# Patient Record
Sex: Male | Born: 1995 | Race: White | Hispanic: No | Marital: Single | Smoking: Current every day smoker
Health system: Southern US, Community
[De-identification: ages and names within clinical notes are randomized; demographics above are authoritative.]

## PROBLEM LIST (undated history)

## (undated) DIAGNOSIS — J45909 Unspecified asthma, uncomplicated: Secondary | ICD-10-CM

## (undated) DIAGNOSIS — H10029 Other mucopurulent conjunctivitis, unspecified eye: Secondary | ICD-10-CM

---

## 2018-01-04 ENCOUNTER — Encounter (HOSPITAL_COMMUNITY): Payer: Self-pay

## 2018-01-04 ENCOUNTER — Ambulatory Visit (HOSPITAL_COMMUNITY)
Admission: EM | Admit: 2018-01-04 | Discharge: 2018-01-04 | Disposition: A | Discharge status: Home / Self Care | Payer: Self-pay | Attending: Internal Medicine | Admitting: Internal Medicine

## 2018-01-04 ENCOUNTER — Other Ambulatory Visit: Payer: Self-pay

## 2018-01-04 DIAGNOSIS — J069 Acute upper respiratory infection, unspecified: Secondary | ICD-10-CM | POA: Insufficient documentation

## 2018-01-04 DIAGNOSIS — B9789 Other viral agents as the cause of diseases classified elsewhere: Secondary | ICD-10-CM | POA: Insufficient documentation

## 2018-01-04 MED ORDER — BENZONATATE 100 MG PO CAPS: 100.0000 mg | ORAL_CAPSULE | Freq: Three times a day (TID) | ORAL | 0 refills | Status: DC

## 2018-01-04 NOTE — ED Triage Notes (Signed)
Pt cc headache and cough and congestion x 3 days.

## 2018-01-04 NOTE — Discharge Instructions (Signed)
I believe this is a viral illness We will treat your cough with Tessalon Perles You can do Mucinex over-the-counter for cough and congestion Tylenol or ibuprofen for pain or fever Try to cut back on smoking as this will exacerbate your symptoms Follow up as needed for continued or worsening symptoms

## 2018-01-08 NOTE — ED Provider Notes (Signed)
[facility]    CSN: [id number] Arrival date & time: 01/04/18  1907     History   Chief Complaint Chief Complaint  Patient presents with  . Cough    HPI Jesse Mitchell is a 22 y.o. male.    URI  Presenting symptoms: congestion, cough, ear pain, rhinorrhea and sore throat   Severity:  Moderate Duration:  3 days Timing:  Constant Chronicity:  New Worsened by:  Nothing Ineffective treatments:  OTC medications Associated symptoms: myalgias   Risk factors: no recent illness, no recent travel and no sick contacts     History reviewed. No pertinent past medical history.  There are no active problems to display for this patient.   History reviewed. No pertinent surgical history.     Home Medications    Prior to Admission medications   Medication Sig Start Date End Date Taking? Authorizing Provider  benzonatate (TESSALON) 100 MG capsule Take 1 capsule (100 mg total) by mouth every 8 (eight) hours. 01/04/18   Janace ArisBast, Juandavid Dallman A, NP    Family History History reviewed. No pertinent family history.  Social History Social History   Tobacco Use  . Smoking status: Current Every Day Smoker    Types: Cigarettes  . Smokeless tobacco: Never Used  . Tobacco comment: pack a day  Substance Use Topics  . Alcohol use: Not on file  . Drug use: Not on file     Allergies   Patient has no allergy information on record.   Review of Systems Review of Systems  HENT: Positive for congestion, ear pain, rhinorrhea and sore throat.   Respiratory: Positive for cough.   Musculoskeletal: Positive for myalgias.     Physical Exam Triage Vital Signs ED Triage Vitals  Enc Vitals Group     BP 01/04/18 2039 126/71     Pulse Rate 01/04/18 2039 99     Resp 01/04/18 2039 16     Temp 01/04/18 2039 99.1 F (37.3 C)     Temp Source 01/04/18 2039 Oral     SpO2 01/04/18 2039 100 %     Weight 01/04/18 2037 215 lb (97.5 kg)     Height --      Head Circumference --      Peak  Flow --      Pain Score 01/04/18 2037 3     Pain Loc --      Pain Edu? --      Excl. in GC? --    No data found.  Updated Vital Signs BP 126/71 (BP Location: Right Arm)   Pulse 99   Temp 99.1 F (37.3 C) (Oral)   Resp 16   Wt 215 lb (97.5 kg)   SpO2 100%   Visual Acuity Right Eye Distance:   Left Eye Distance:   Bilateral Distance:    Right Eye Near:   Left Eye Near:    Bilateral Near:     Physical Exam Vitals signs and nursing note reviewed.  Constitutional:      Appearance: Normal appearance. He is not ill-appearing or toxic-appearing.  HENT:     Head: Normocephalic and atraumatic.     Right Ear: Tympanic membrane, ear canal and external ear normal.     Left Ear: Tympanic membrane, ear canal and external ear normal.     Nose: Congestion present.     Mouth/Throat:     Pharynx: Oropharynx is clear.  Eyes:     Conjunctiva/sclera: Conjunctivae normal.  Neck:  Musculoskeletal: Normal range of motion.  Cardiovascular:     Rate and Rhythm: Normal rate and regular rhythm.     Heart sounds: Normal heart sounds.  Pulmonary:     Effort: Pulmonary effort is normal.     Breath sounds: Normal breath sounds.  Musculoskeletal: Normal range of motion.  Lymphadenopathy:     Cervical: No cervical adenopathy.  Skin:    General: Skin is warm and dry.  Neurological:     Mental Status: He is alert.  Psychiatric:        Mood and Affect: Mood normal.      UC Treatments / Results  Labs (all labs ordered are listed, but only abnormal results are displayed) Labs Reviewed - No data to display  EKG None  Radiology No results found.  Procedures Procedures (including critical care time)  Medications Ordered in UC Medications - No data to display  Initial Impression / Assessment and Plan / UC Course  I have reviewed the triage vital signs and the nursing notes.  Pertinent labs & imaging results that were available during my care of the patient were reviewed by me  and considered in my medical decision making (see chart for details).     Viral URI  Tessalon pearls for cough mucinex OTC for cough and congestion Follow up as needed for continued or worsening symptoms  Final Clinical Impressions(s) / UC Diagnoses   Final diagnoses:  Viral URI with cough     Discharge Instructions     I believe this is a viral illness We will treat your cough with Tessalon Perles You can do Mucinex over-the-counter for cough and congestion Tylenol or ibuprofen for pain or fever Try to cut back on smoking as this will exacerbate your symptoms Follow up as needed for continued or worsening symptoms     ED Prescriptions    Medication Sig Dispense Auth. Provider   benzonatate (TESSALON) 100 MG capsule Take 1 capsule (100 mg total) by mouth every 8 (eight) hours. 21 capsule Dahlia ByesBast, Eron Goble A, NP     Controlled Substance Prescriptions [place] Controlled Substance Registry consulted? no   Janace ArisBast, Layten Aiken A, NP 01/08/18 1708

## 2018-03-28 ENCOUNTER — Emergency Department (HOSPITAL_BASED_OUTPATIENT_CLINIC_OR_DEPARTMENT_OTHER)
Admission: EM | Admit: 2018-03-28 | Discharge: 2018-03-28 | Disposition: A | Discharge status: Home / Self Care | Payer: Self-pay | Attending: Emergency Medicine | Admitting: Emergency Medicine

## 2018-03-28 ENCOUNTER — Other Ambulatory Visit: Payer: Self-pay

## 2018-03-28 ENCOUNTER — Encounter (HOSPITAL_BASED_OUTPATIENT_CLINIC_OR_DEPARTMENT_OTHER): Payer: Self-pay | Admitting: Emergency Medicine

## 2018-03-28 ENCOUNTER — Emergency Department (HOSPITAL_BASED_OUTPATIENT_CLINIC_OR_DEPARTMENT_OTHER): Payer: Self-pay

## 2018-03-28 DIAGNOSIS — F17210 Nicotine dependence, cigarettes, uncomplicated: Secondary | ICD-10-CM | POA: Insufficient documentation

## 2018-03-28 DIAGNOSIS — L03213 Periorbital cellulitis: Secondary | ICD-10-CM | POA: Insufficient documentation

## 2018-03-28 HISTORY — DX: Other mucopurulent conjunctivitis, unspecified eye: H10.029

## 2018-03-28 HISTORY — DX: Unspecified asthma, uncomplicated: J45.909

## 2018-03-28 LAB — BASIC METABOLIC PANEL
Anion gap: 5 (ref 5–15)
BUN: 8 mg/dL (ref 6–20)
CO2: 25 mmol/L (ref 22–32)
Calcium: 9 mg/dL (ref 8.9–10.3)
Chloride: 107 mmol/L (ref 98–111)
Creatinine, Ser: 0.67 mg/dL (ref 0.61–1.24)
GFR calc Af Amer: >60 mL/min (ref >60)
GFR calc non Af Amer: >60 mL/min (ref >60)
Glucose, Bld: 109 mg/dL — ABNORMAL HIGH (ref 70–99)
POTASSIUM: 3.9 mmol/L (ref 3.5–5.1)
SODIUM: 137 mmol/L (ref 135–145)

## 2018-03-28 LAB — CBC WITH DIFFERENTIAL/PLATELET
ABS IMMATURE GRANULOCYTES: 0.07 10*3/uL (ref 0.00–0.07)
Basophils Absolute: 0.1 10*3/uL (ref 0.0–0.1)
Basophils Relative: 1 %
Eosinophils Absolute: 0.1 10*3/uL (ref 0.0–0.5)
Eosinophils Relative: 1 %
HCT: 45.3 % (ref 39.0–52.0)
Hemoglobin: 15.3 g/dL (ref 13.0–17.0)
IMMATURE GRANULOCYTES: 1 %
Lymphocytes Relative: 9 %
Lymphs Abs: 1.4 10*3/uL (ref 0.7–4.0)
MCH: 30.7 pg (ref 26.0–34.0)
MCHC: 33.8 g/dL (ref 30.0–36.0)
MCV: 91 fL (ref 80.0–100.0)
Monocytes Absolute: 2.3 10*3/uL — ABNORMAL HIGH (ref 0.1–1.0)
Monocytes Relative: 15 %
NEUTROS ABS: 11.5 10*3/uL — AB (ref 1.7–7.7)
Neutrophils Relative %: 73 %
Platelets: 213 10*3/uL (ref 150–400)
RBC: 4.98 MIL/uL (ref 4.22–5.81)
RDW: 12.4 % (ref 11.5–15.5)
WBC: 15.4 10*3/uL — ABNORMAL HIGH (ref 4.0–10.5)
nRBC: 0 % (ref 0.0–0.2)

## 2018-03-28 MED ORDER — TETRACAINE HCL 0.5 % OP SOLN
2.0000 [drp] | Freq: Once | OPHTHALMIC | Status: AC
  Administered 2018-03-28: 2 [drp] via OPHTHALMIC
  Filled 2018-03-28: qty 4

## 2018-03-28 MED ORDER — TETANUS-DIPHTH-ACELL PERTUSSIS 5-2.5-18.5 LF-MCG/0.5 IM SUSP
0.5000 mL | Freq: Once | INTRAMUSCULAR | Status: AC
  Administered 2018-03-28: 0.5 mL via INTRAMUSCULAR
  Filled 2018-03-28: qty 0.5

## 2018-03-28 MED ORDER — CEFDINIR 300 MG PO CAPS
300.0000 mg | ORAL_CAPSULE | Freq: Once | ORAL | Status: DC
  Filled 2018-03-28: qty 1

## 2018-03-28 MED ORDER — CLINDAMYCIN PHOSPHATE 600 MG/50ML IV SOLN
600.0000 mg | Freq: Once | INTRAVENOUS | Status: AC
  Administered 2018-03-28: 600 mg via INTRAVENOUS
  Filled 2018-03-28: qty 50

## 2018-03-28 MED ORDER — FLUORESCEIN SODIUM 1 MG OP STRP
1.0000 | ORAL_STRIP | Freq: Once | OPHTHALMIC | Status: AC
  Administered 2018-03-28: 1 via OPHTHALMIC
  Filled 2018-03-28: qty 1

## 2018-03-28 MED ORDER — CLINDAMYCIN HCL 300 MG PO CAPS: 300.0000 mg | ORAL_CAPSULE | Freq: Three times a day (TID) | ORAL | 0 refills | Status: DC

## 2018-03-28 MED ORDER — CEFDINIR 300 MG PO CAPS: 300.0000 mg | ORAL_CAPSULE | Freq: Two times a day (BID) | ORAL | 0 refills | Status: DC

## 2018-03-28 MED ORDER — IOHEXOL 300 MG/ML  SOLN
100.0000 mL | Freq: Once | INTRAMUSCULAR | Status: AC | PRN
  Administered 2018-03-28: 80 mL via INTRAVENOUS

## 2018-03-28 NOTE — ED Notes (Signed)
ED Provider at bedside. 

## 2018-03-28 NOTE — ED Notes (Signed)
ED Provider at bedside. Dr Steinl 

## 2018-03-28 NOTE — ED Provider Notes (Signed)
[facility] EMERGENCY DEPARTMENT Provider Note   CSN: [id number] Arrival date & time: 03/28/18  1209    History   Chief Complaint Chief Complaint  Patient presents with   Facial Swelling    HPI Jesse Mitchell is a 23 y.o. male with a hx of tobacco abuse who presents to the ER with complaints of L sided facial swelling/redness that worsened this AM. Patient states he has had a small cyst on the bridge of his nose for about a year that was never particularly bothersome until it came to a head about 1 week ago. He states he applied pressure with expression of purulent material with relief. He notes that it had been okay again until last night the area began to have some spreading redness (still localized to the nose) and he picked at it again this time without drainage. He went to bed and this AM woke up w/ redness & swelling spread to the L eye. He states it is not overly painful unless pushed on. The eye itself does not hurt either. He had some improvement w/ warm compresses today, no other alleviating/aggravating factors. Seen @ UC sent to ER for further evaluation. Denies fever, chills, chang in vision, contact lens use, pain with EOM, eye purulent drainage, congestion, or sore throat.     HPI  Past Medical History:  Diagnosis Date   Asthma    Pink eye     There are no active problems to display for this patient.   History reviewed. No pertinent surgical history.      Home Medications    Prior to Admission medications   Medication Sig Start Date End Date Taking? Authorizing Provider  benzonatate (TESSALON) 100 MG capsule Take 1 capsule (100 mg total) by mouth every 8 (eight) hours. 01/04/18   Janace Aris, NP    Family History No family history on file.  Social History Social History   Tobacco Use   Smoking status: Current Every Day Smoker    Types: Cigarettes   Smokeless tobacco: Never Used   Tobacco comment: pack a day  Substance Use Topics    Alcohol use: Not Currently   Drug use: Never     Allergies   Other   Review of Systems Review of Systems  Constitutional: Negative for chills and fever.  HENT: Negative for congestion, ear pain and sore throat.   Eyes: Negative for photophobia, pain, discharge, itching and visual disturbance.       Positive for periorbital swelling/erythema,.   Skin: Positive for wound.  All other systems reviewed and are negative.    Physical Exam Updated Vital Signs BP (!) 155/87 (BP Location: Left Arm)    Pulse (!) 103    Temp 98.4 F (36.9 C) (Oral)    Resp 16    Ht 6\' 1"  (1.854 m)    Wt 95.3 kg    SpO2 100%    BMI 27.71 kg/m   Physical Exam Vitals signs and nursing note reviewed.  Constitutional:      General: He is not in acute distress.    Appearance: He is well-developed. He is not toxic-appearing.  HENT:     Head: Normocephalic and atraumatic.     Right Ear: Tympanic membrane, ear canal and external ear normal.     Left Ear: Tympanic membrane, ear canal and external ear normal.     Nose: No congestion or rhinorrhea.     Mouth/Throat:     Mouth: Mucous membranes are  moist.     Comments: Uvula midline.  Eyes:     General:        Right eye: No discharge.        Left eye: No discharge.     Extraocular Movements: Extraocular movements intact.     Conjunctiva/sclera:     Right eye: Right conjunctiva is not injected. No chemosis, exudate or hemorrhage.    Left eye: Left conjunctiva is injected (minimal). No exudate or hemorrhage.    Comments: Some tearing from L eye.  PERRL.  No photophobia.  No proptosis.  Nasal bridge has somewhat scabbed area that is mildly tender and swollen. Erythema/swelling around this area which extend to L periorbital region.  Visual Acuity Bilateral Distance:20/100 (pt states he is supposed to wear corrective lenses (glasses) for distance.) R Distance:20/100 L Distance:20/70  L eye Fluorescein stain: No uptake. No evidence of corneal  abrasion/ulceration.  No hyphema.   No appreciable FB.   Neck:     Musculoskeletal: Normal range of motion and neck supple. No neck rigidity.  Cardiovascular:     Rate and Rhythm: Normal rate and regular rhythm.  Pulmonary:     Effort: Pulmonary effort is normal. No respiratory distress.     Breath sounds: Normal breath sounds. No wheezing, rhonchi or rales.  Abdominal:     General: There is no distension.     Palpations: Abdomen is soft.     Tenderness: There is no abdominal tenderness.  Lymphadenopathy:     Cervical: No cervical adenopathy.  Skin:    General: Skin is warm and dry.     Findings: No rash.  Neurological:     Mental Status: He is alert.     Comments: Clear speech.   Psychiatric:        Behavior: Behavior normal.        ED Treatments / Results  Labs (all labs ordered are listed, but only abnormal results are displayed) Labs Reviewed  CBC WITH DIFFERENTIAL/PLATELET - Abnormal; Notable for the following components:      Result Value   WBC 15.4 (*)    Neutro Abs 11.5 (*)    Monocytes Absolute 2.3 (*)    All other components within normal limits  BASIC METABOLIC PANEL - Abnormal; Notable for the following components:   Glucose, Bld 109 (*)    All other components within normal limits    EKG None  Radiology Ct Orbits W Contrast  Result Date: 03/28/2018 CLINICAL DATA:  23 year old male with left periorbital swelling and redness this morning. EXAM: CT ORBITS WITH CONTRAST TECHNIQUE: Multidetector CT images was performed according to the standard protocol following intravenous contrast administration. CONTRAST:  80mL OMNIPAQUE IOHEXOL 300 MG/ML  SOLN COMPARISON:  None. FINDINGS: Orbits:  Orbital walls are intact. Confluent left periorbital and preseptal soft tissue swelling and stranding, nearly 2 centimeters thick in some areas. The underlying left globe is intact and the postseptal soft tissues are symmetric and normal. No associated soft tissue gas. No  fluid collection identified. The inflammation extends across the bridge of the nose and mildly to the medial right preseptal space. Osseous structures: Other visible osseous structures are intact. Visible maxillary dentition is within normal limits. Visualized sinuses: Clear aside from minimal mucosal thickening or small retention cysts in the maxillary sinus alveolar recesses. Tympanic cavities and mastoids are clear. Soft tissues: Left forehead, periorbital and premalar space inflammation primarily on the left as stated above. Negative visible pharynx, parapharyngeal spaces, retropharyngeal space, masticator spaces, parotid  spaces. No upper cervical lymphadenopathy. The visible major vascular structures at the skull base appear patent, including the bilateral cavernous sinus. Limited intracranial: Negative. IMPRESSION: Confluent left periorbital cellulitis with only preseptal space involvement and no abscess or complicating features. Electronically Signed   By: Odessa Fleming M.D.   On: 03/28/2018 15:22    Procedures Procedures (including critical care time)  Medications Ordered in ED Medications  cefdinir (OMNICEF) capsule 300 mg (has no administration in time range)  iohexol (OMNIPAQUE) 300 MG/ML solution 100 mL (80 mLs Intravenous Contrast Given 03/28/18 1445)  fluorescein ophthalmic strip 1 strip (1 strip Left Eye Given 03/28/18 1534)  tetracaine (PONTOCAINE) 0.5 % ophthalmic solution 2 drop (2 drops Left Eye Given 03/28/18 1534)  clindamycin (CLEOCIN) IVPB 600 mg (0 mg Intravenous Stopped 03/28/18 1624)  Tdap (BOOSTRIX) injection 0.5 mL (0.5 mLs Intramuscular Given 03/28/18 1550)     Initial Impression / Assessment and Plan / ED Course  I have reviewed the triage vital signs and the nursing notes.  Pertinent labs & imaging results that were available during my care of the patient were reviewed by me and considered in my medical decision making (see chart for details).  Patient presents to the  emergency department from urgent care for evaluation of left periorbital swelling/erythema.  Patient nontoxic-appearing, no apparent distress, initial tachycardia and hypertension normalized with repeat vitals and on my exam.  Detailed exam above, patient does have notable left periorbital erythema/swelling.  Pupils equal round and reactive to light.  Extraocular movements intact and painless.  No consensual photophobia.  Fluorescein stain without uptake to suggest abrasion, no appreciable ulceration.  No visible foreign bodies.  Visual acuity is poor, but bilaterally without his glasses.  Plan for labs and CT orbits with contrast to evaluate for preseptal versus septal cellulitis.  Work-up reviewed: BC: Leukocytosis at 15.4 with left shift BMP: Fairly unremarkable CT: Findings consistent with confluent left periorbital cellulitis with only preseptal space involvement.  No abscess or complicating features noted.  Tdap updated. IV clindamycin prior to discharge. Unfortunately do not have cefdinir at this facility for first done. Discharge home with clindamycin & cefdinir. PCP follow up. Strict return precautions. I discussed results, treatment plan, need for follow-up, and return precautions with the patient. Provided opportunity for questions, patient confirmed understanding and is in agreement with plan.   Findings and plan of care discussed with supervising physician Dr. Denton Lank who is in agreement.   Final Clinical Impressions(s) / ED Diagnoses   Final diagnoses:  Preseptal cellulitis    ED Discharge Orders         Ordered    clindamycin (CLEOCIN) 300 MG capsule  3 times daily     03/28/18 1653    cefdinir (OMNICEF) 300 MG capsule  2 times daily     03/28/18 [address], [place] R, PA-C 03/28/18 1656    Cathren Laine, MD 03/30/18 1539

## 2018-03-28 NOTE — Discharge Instructions (Addendum)
You were seen in the emergency department today for left eye redness/swelling.  Your CT scan showed preseptal otherwise known as periorbital cellulitis.  This is an infection around the eye.  We are sending you home with antibiotics including clindamycin and cefdinir.  Please take these antibiotics as prescribed.  We have prescribed you new medication(s) today. Discuss the medications prescribed today with your pharmacist as they can have adverse effects and interactions with your other medicines including over the counter and prescribed medications. Seek medical evaluation if you start to experience new or abnormal symptoms after taking one of these medicines, seek care immediately if you start to experience difficulty breathing, feeling of your throat closing, facial swelling, or rash as these could be indications of a more serious allergic reaction  It is very important that you follow-up with primary care within 48 hours if you do not have primary care follow up call the attached number to make an appointment or return to the ER.  Return to the emergency department immediately for increased swelling/redness, pain, pain moving your eye, inability to move your eye, change in your vision, fever, or any other concerns at all.

## 2018-03-28 NOTE — ED Triage Notes (Signed)
Pt here for redness, clear drainage and swelling to the left eye onset 4 am today. Pt has abscess to bridge of nose that he was messing with last night.

## 2019-04-14 ENCOUNTER — Ambulatory Visit: Payer: Self-pay | Attending: Internal Medicine

## 2019-04-14 DIAGNOSIS — Z23 Encounter for immunization: Secondary | ICD-10-CM

## 2019-04-14 NOTE — Progress Notes (Signed)
   Covid-19 Vaccination Clinic  Name:  Jesse Mitchell    MRN: [id number] DOB: 1995/04/15  04/14/2019  [name] was observed post Covid-19 immunization for 15 minutes without incident. He was provided with Vaccine Information Sheet and instruction to access the V-Safe system.   [name] was instructed to call 911 with any severe reactions post vaccine: Marland Kitchen Difficulty breathing  . Swelling of face and throat  . A fast heartbeat  . A bad rash all over body  . Dizziness and weakness   Immunizations Administered    Name Date Dose VIS Date Route   Pfizer COVID-19 Vaccine 04/14/2019  9:35 AM 0.3 mL 12/24/2018 Intramuscular   Manufacturer: [employer], [employer]   Lot: [id number]   NDC: [address]-1

## 2019-05-09 ENCOUNTER — Ambulatory Visit: Payer: Self-pay | Attending: Internal Medicine

## 2019-05-09 DIAGNOSIS — Z23 Encounter for immunization: Secondary | ICD-10-CM

## 2019-05-09 NOTE — Progress Notes (Signed)
   Covid-19 Vaccination Clinic  Name:  Jesse Mitchell    MRN: [id number] DOB: 1995/04/14  05/09/2019  Jesse Mitchell was observed post Covid-19 immunization for 15 minutes without incident. He was provided with Vaccine Information Sheet and instruction to access the V-Safe system.   Jesse Mitchell was instructed to call 911 with any severe reactions post vaccine: Marland Kitchen Difficulty breathing  . Swelling of face and throat  . A fast heartbeat  . A bad rash all over body  . Dizziness and weakness   Immunizations Administered    Name Date Dose VIS Date Route   Pfizer COVID-19 Vaccine 05/09/2019 11:49 AM 0.3 mL 03/09/2018 Intramuscular   Manufacturer: [employer], [employer]   Lot: [id number]   NDC: [address]-7

## 2020-07-26 IMAGING — CT CT ORBITS WITH CONTRAST
3 series · 14 of 47 positions shown, 16 images · IV contrast (omnipaque)
Comparison: None.

CLINICAL DATA: 23-year-old male with left periorbital swelling and
redness this morning.

EXAM:
CT ORBITS WITH CONTRAST
TECHNIQUE: Multidetector CT images was performed according to the standard
protocol following intravenous contrast administration.
CONTRAST:  80mL OMNIPAQUE IOHEXOL 300 MG/ML  SOLN

[Series 3: orbits 2.0 h30s st · axial · 0.41mm/px · z∈[-140,-62]mm · 8 of 47 slices shown, 10 images]
[im 4/47  brain]
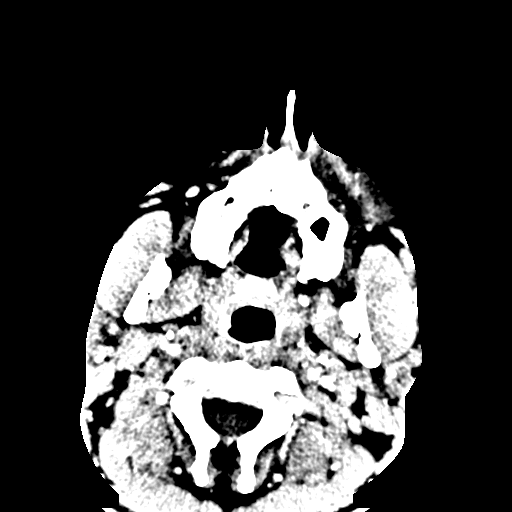
[im 4/47  bone]
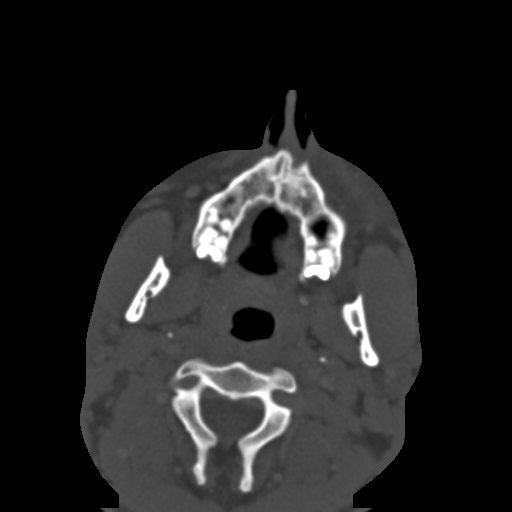
[im 10/47  bone]
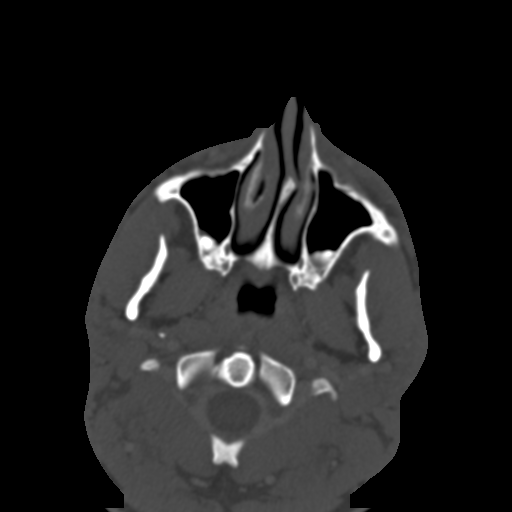
[im 15/47  bone]
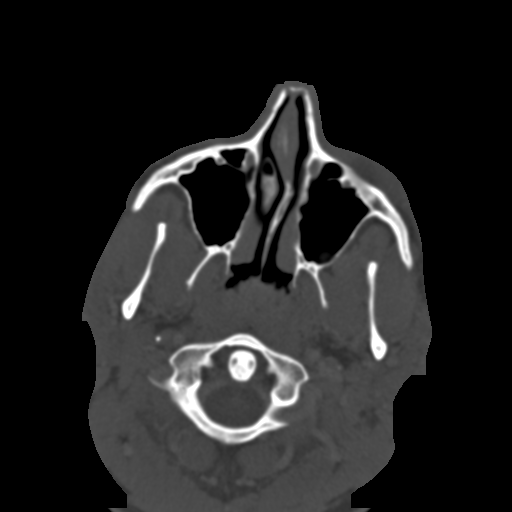
[im 21/47  bone]
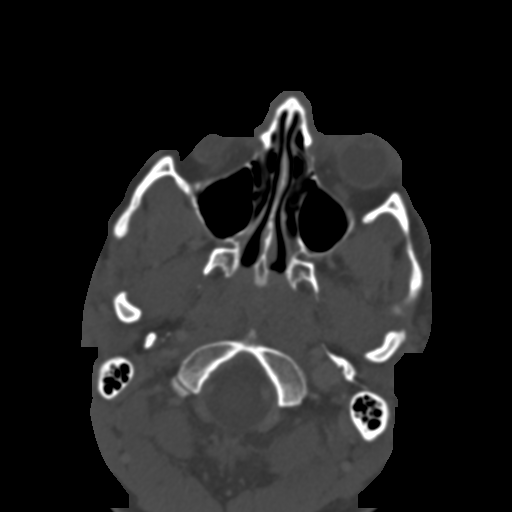
[im 26/47  brain]
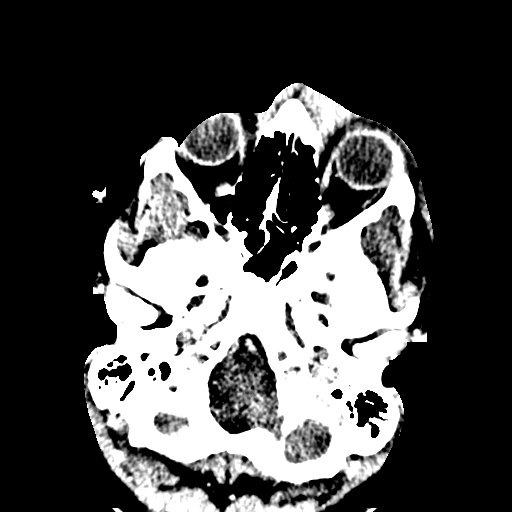
[im 26/47  bone]
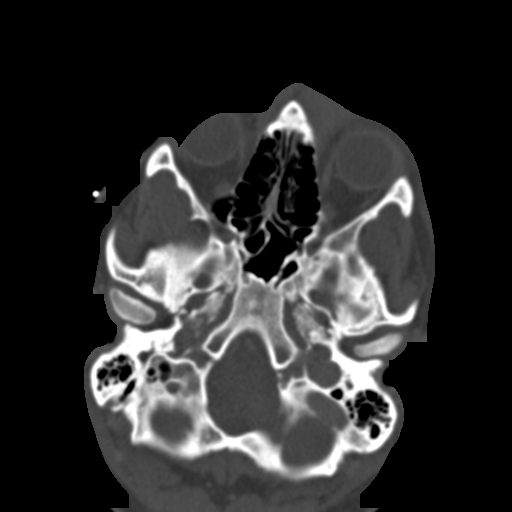
[im 32/47  bone]
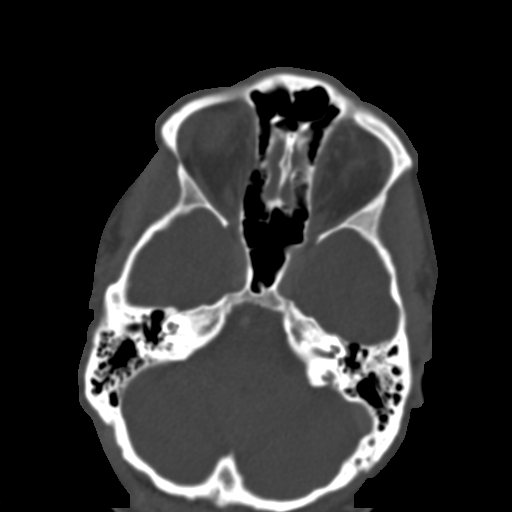
[im 37/47  bone]
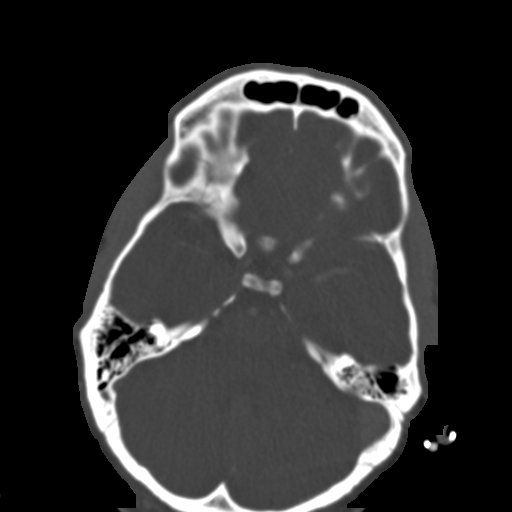
[im 43/47  bone]
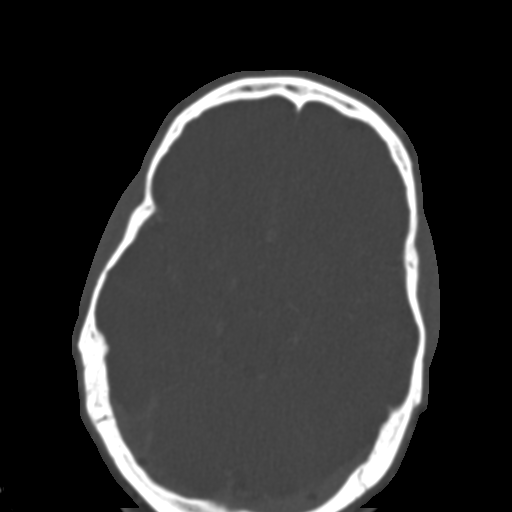

[Series 7: orbits 2.0 coronal · coronal · 0.25mm/px · 3 of 77 slices shown]
[im 26/77  bone]
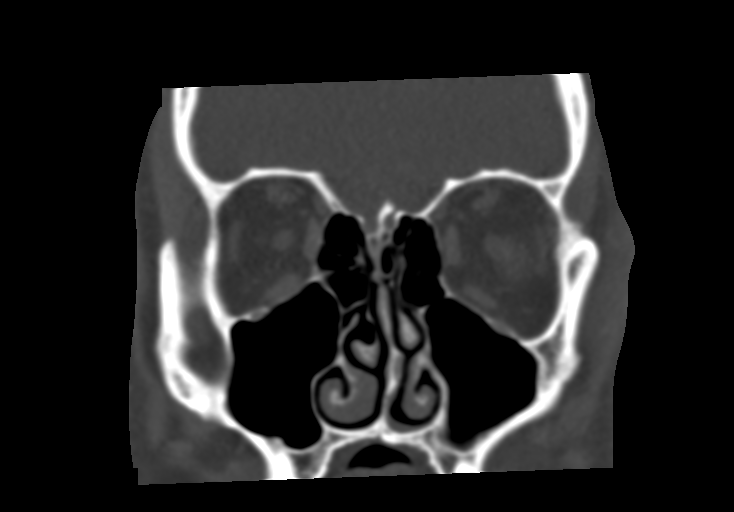
[im 34/77  bone]
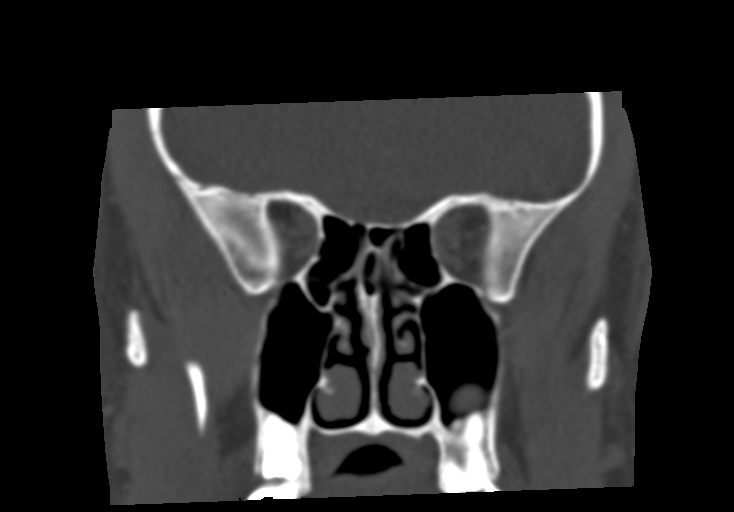
[im 43/77  bone]
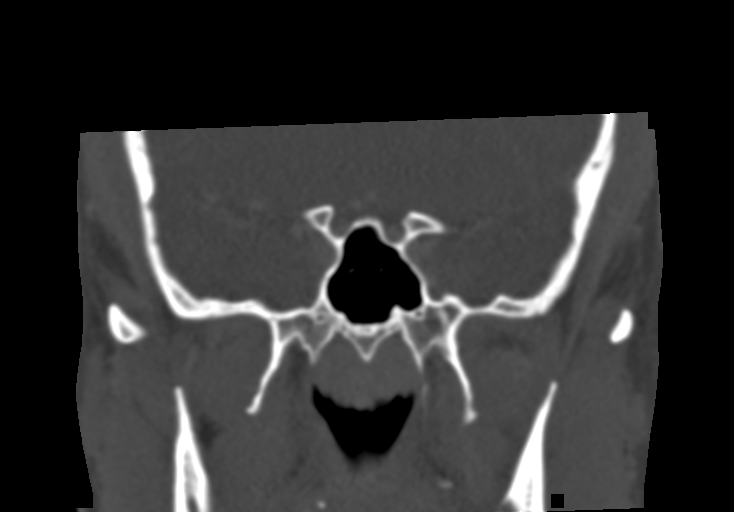

[Series 9: orbits 2.0 sagittal · sagittal · 0.25mm/px · 3 of 91 slices shown]
[im 31/91  bone]
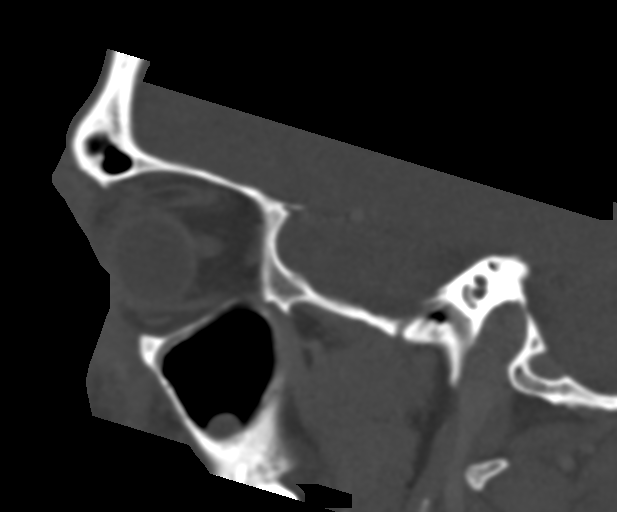
[im 46/91  bone]
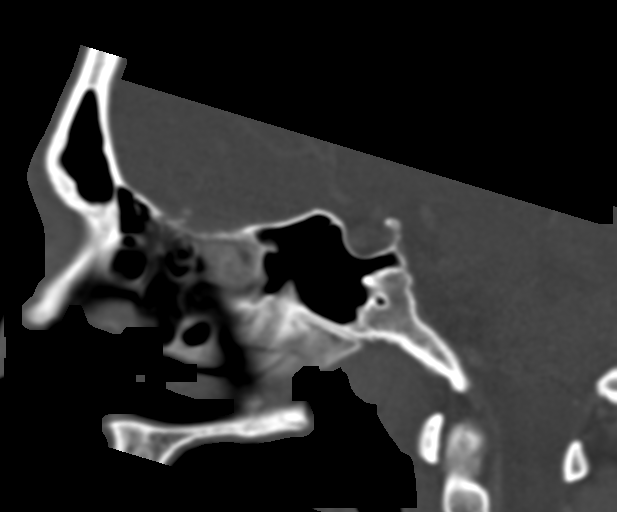
[im 61/91  bone]
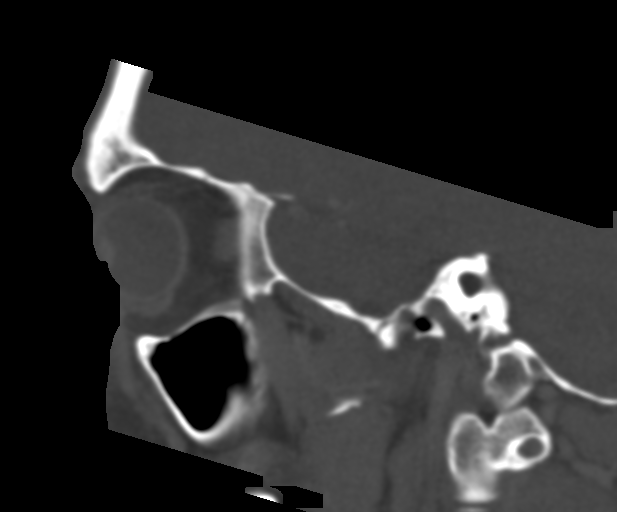

[14 of 47 positions shown; findings below may reference images not displayed]

FINDINGS: Orbits:  Orbital walls are intact.

Confluent left periorbital and preseptal soft tissue swelling and
stranding, nearly 2 centimeters thick in some areas. The underlying
left globe is intact and the postseptal soft tissues are symmetric
and normal.

No associated soft tissue gas. No fluid collection identified. The
inflammation extends across the bridge of the nose and mildly to the
medial right preseptal space.

Osseous structures: Other visible osseous structures are intact.
Visible maxillary dentition is within normal limits.

Visualized sinuses: Clear aside from minimal mucosal thickening or
small retention cysts in the maxillary sinus alveolar recesses.
Tympanic cavities and mastoids are clear.

Soft tissues: Left forehead, periorbital and premalar space
inflammation primarily on the left as stated above.

Negative visible pharynx, parapharyngeal spaces, retropharyngeal
space, masticator spaces, parotid spaces. No upper cervical
lymphadenopathy.

The visible major vascular structures at the skull base appear
patent, including the bilateral cavernous sinus.

Limited intracranial: Negative.
IMPRESSION: Confluent left periorbital cellulitis with only preseptal space
involvement and no abscess or complicating features.

## 2021-04-05 ENCOUNTER — Ambulatory Visit
Admission: EM | Admit: 2021-04-05 | Discharge: 2021-04-05 | Disposition: A | Discharge status: Home / Self Care | Payer: 59

## 2021-04-05 ENCOUNTER — Other Ambulatory Visit: Payer: Self-pay

## 2021-04-05 DIAGNOSIS — Z760 Encounter for issue of repeat prescription: Secondary | ICD-10-CM | POA: Diagnosis not present

## 2021-04-05 DIAGNOSIS — H6121 Impacted cerumen, right ear: Secondary | ICD-10-CM

## 2021-04-05 DIAGNOSIS — Z8659 Personal history of other mental and behavioral disorders: Secondary | ICD-10-CM | POA: Diagnosis not present

## 2021-04-05 DIAGNOSIS — H6123 Impacted cerumen, bilateral: Secondary | ICD-10-CM | POA: Diagnosis not present

## 2021-04-05 MED ORDER — BUPROPION HCL ER (XL) 300 MG PO TB24: 300.0000 mg | ORAL_TABLET | Freq: Every day | ORAL | 0 refills | Status: AC

## 2021-04-05 NOTE — ED Provider Notes (Signed)
?[facility] ? ? ? ?CSN: [id number] ?Arrival date & time: 04/05/21  1023 ? ? ?  ? ?History   ?Chief Complaint ?Chief Complaint  ?Patient presents with  ? right ear hearing dec  ? ? ?HPI ?Jesse Mitchell is a 26 y.o. male.  ? ?Patient presents today with a weeklong history of decreased hearing in his right ear.  He attempted to use over-the-counter ceruminolytic medication without improvement of symptoms.  He denies history of cerumen impaction but reports his father has episodes with cerumen impaction with similar symptoms.  He does not use earbuds or earplugs on a regular basis.  Does not use Q-tips.  He denies any recent illness or URI symptoms including cough, congestion, fever, nausea, vomiting.  He denies any associated ear pain or otorrhea.  Denies any recent antibiotic use. ? ? ?Past Medical History:  ?Diagnosis Date  ? Asthma   ? Pink eye   ? ? ?There are no problems to display for this patient. ? ? ?History reviewed. No pertinent surgical history. ? ? ? ? ?Home Medications   ? ?Prior to Admission medications   ?Medication Sig Start Date End Date Taking? Authorizing Provider  ?buPROPion (WELLBUTRIN XL) 300 MG 24 hr tablet Take 1 tablet (300 mg total) by mouth daily. 04/05/21   Carlis Blanchard, Noberto Retort, PA-C  ? ? ?Family History ?History reviewed. No pertinent family history. ? ?Social History ?Social History  ? ?Tobacco Use  ? Smoking status: Every Day  ?  Types: Cigarettes  ? Smokeless tobacco: Never  ? Tobacco comments:  ?  pack a day  ?Vaping Use  ? Vaping Use: Never used  ?Substance Use Topics  ? Alcohol use: Not Currently  ? Drug use: Never  ? ? ? ?Allergies   ?Other ? ? ?Review of Systems ?Review of Systems  ?Constitutional:  Negative for activity change, appetite change, fatigue and fever.  ?HENT:  Positive for hearing loss. Negative for congestion, ear pain, sinus pressure, sneezing, sore throat and tinnitus.   ?Respiratory:  Negative for cough and shortness of breath.   ?Cardiovascular:  Negative for  chest pain.  ?Neurological:  Negative for dizziness, light-headedness and headaches.  ? ? ?Physical Exam ?Triage Vital Signs ?ED Triage Vitals  ?Enc Vitals Group  ?   BP 04/05/21 1235 121/79  ?   Pulse Rate 04/05/21 1235 70  ?   Resp 04/05/21 1235 18  ?   Temp 04/05/21 1235 97.8 ?F (36.6 ?C)  ?   Temp Source 04/05/21 1235 Oral  ?   SpO2 04/05/21 1235 98 %  ?   Weight --   ?   Height --   ?   Head Circumference --   ?   Peak Flow --   ?   Pain Score 04/05/21 1236 0  ?   Pain Loc --   ?   Pain Edu? --   ?   Excl. in GC? --   ? ?No data found. ? ?Updated Vital Signs ?BP 121/79 (BP Location: Left Arm)   Pulse 70   Temp 97.8 ?F (36.6 ?C) (Oral)   Resp 18   SpO2 98%  ? ?Visual Acuity ?Right Eye Distance:   ?Left Eye Distance:   ?Bilateral Distance:   ? ?Right Eye Near:   ?Left Eye Near:    ?Bilateral Near:    ? ?Physical Exam ?Vitals reviewed.  ?Constitutional:   ?   General: He is awake.  ?   Appearance: Normal appearance.  He is well-developed. He is not ill-appearing.  ?   Comments: Very pleasant male appears stated age in no acute distress sitting comfortably in exam room  ?HENT:  ?   Head: Normocephalic and atraumatic.  ?   Right Ear: Tympanic membrane, ear canal and external ear normal. There is impacted cerumen. Tympanic membrane is not erythematous or bulging.  ?   Left Ear: External ear normal. There is impacted cerumen.  ?   Ears:  ?   Comments: Cerumen impaction noted bilaterally; cerumen resolved with in office irrigation on the right revealing normal TM.  Cerumen impaction was unable to be completely removed on the left. ?   Nose: Nose normal.  ?   Mouth/Throat:  ?   Pharynx: Uvula midline. No oropharyngeal exudate or posterior oropharyngeal erythema.  ?Cardiovascular:  ?   Rate and Rhythm: Normal rate and regular rhythm.  ?   Heart sounds: Normal heart sounds, S1 normal and S2 normal. No murmur heard. ?Pulmonary:  ?   Effort: Pulmonary effort is normal. No accessory muscle usage or respiratory distress.   ?   Breath sounds: Normal breath sounds. No stridor. No wheezing, rhonchi or rales.  ?   Comments: Clear to auscultation bilaterally ?Neurological:  ?   Mental Status: He is alert.  ?Psychiatric:     ?   Mood and Affect: Mood normal. Mood is not anxious or depressed.     ?   Speech: Speech normal.     ?   Behavior: Behavior is cooperative.  ? ? ? ?UC Treatments / Results  ?Labs ?(all labs ordered are listed, but only abnormal results are displayed) ?Labs Reviewed - No data to display ? ?EKG ? ? ?Radiology ?No results found. ? ?Procedures ?Procedures (including critical care time) ? ?Medications Ordered in UC ?Medications - No data to display ? ?Initial Impression / Assessment and Plan / UC Course  ?I have reviewed the triage vital signs and the nursing notes. ? ?Pertinent labs & imaging results that were available during my care of the patient were reviewed by me and considered in my medical decision making (see chart for details). ? ?  ? ?Cerumen impaction resolved on right with in office irrigation revealing normal TM.  Recommended continued use of ceruminolytic on left.  Discouraged use of Q-tips and earbud/earplugs.  Recommended follow-up with ENT if symptoms persist was given contact information for local provider with instruction to call to schedule an appointment if needed.  Discussed alarm symptoms that warrant emergent evaluation.  Strict return precautions given to which he expressed understanding. ? ?At the end of visit patient requested a refill of Wellbutrin.  He is currently between insurances and in the process of establishing with a new PCP.  Reports he has been doing well on this medication for several months.  He denies any current thoughts of suicide/harm or harming others.  Denies any side effects with this medication.  He is requesting refill if appropriate. ? ?Final Clinical Impressions(s) / UC Diagnoses  ? ?Final diagnoses:  ?Bilateral impacted cerumen  ?Hearing loss due to cerumen impaction,  right  ?Medication refill  ?History of depression  ? ? ? ?Discharge Instructions   ? ?  ?We are able to remove the wax from your right ear.  Continue using Debrox over-the-counter in your left ear.  Do not use Q-tips, earbuds, earplugs.  If your symptoms persist follow-up with ENT; call to schedule an appointment.  If anything changes please return for reevaluation. ? ?  I have sent in a refill of your bupropion.  Please follow-up with PCP; we will try to help you establish with someone.  If you have any medication side effects or worsening depression/anxiety symptoms you need to be reevaluated immediately. ? ? ? ? ?ED Prescriptions   ? ? Medication Sig Dispense Auth. Provider  ? buPROPion (WELLBUTRIN XL) 300 MG 24 hr tablet Take 1 tablet (300 mg total) by mouth daily. 30 tablet Damarian Priola K, PA-C  ? ?  ? ?PDMP not reviewed this encounter. ?  ?Jeani Hawking, PA-C ?04/05/21 1334 ? ?

## 2021-04-05 NOTE — ED Triage Notes (Signed)
Pt c/o decreased hearing to right ear since Sunday. States teledoc requested he been seen at [facility] for possible irrigation.  ?

## 2021-04-05 NOTE — Discharge Instructions (Signed)
We are able to remove the wax from your right ear.  Continue using Debrox over-the-counter in your left ear.  Do not use Q-tips, earbuds, earplugs.  If your symptoms persist follow-up with ENT; call to schedule an appointment.  If anything changes please return for reevaluation. ? ?I have sent in a refill of your bupropion.  Please follow-up with PCP; we will try to help you establish with someone.  If you have any medication side effects or worsening depression/anxiety symptoms you need to be reevaluated immediately. ?

## 2023-12-08 ENCOUNTER — Other Ambulatory Visit: Payer: Self-pay

## 2023-12-08 ENCOUNTER — Emergency Department (HOSPITAL_COMMUNITY)

## 2023-12-08 ENCOUNTER — Emergency Department (HOSPITAL_COMMUNITY)
Admission: EM | Admit: 2023-12-08 | Discharge: 2023-12-08 | Discharge status: Against Medical Advice | Attending: Emergency Medicine | Admitting: Emergency Medicine

## 2023-12-08 ENCOUNTER — Encounter (HOSPITAL_COMMUNITY): Payer: Self-pay

## 2023-12-08 DIAGNOSIS — Z5321 Procedure and treatment not carried out due to patient leaving prior to being seen by health care provider: Secondary | ICD-10-CM | POA: Diagnosis not present

## 2023-12-08 DIAGNOSIS — M25552 Pain in left hip: Secondary | ICD-10-CM | POA: Diagnosis not present

## 2023-12-08 DIAGNOSIS — Y92410 Unspecified street and highway as the place of occurrence of the external cause: Secondary | ICD-10-CM | POA: Insufficient documentation

## 2023-12-08 DIAGNOSIS — M25561 Pain in right knee: Secondary | ICD-10-CM | POA: Insufficient documentation

## 2023-12-08 DIAGNOSIS — M545 Low back pain, unspecified: Secondary | ICD-10-CM | POA: Insufficient documentation

## 2023-12-08 DIAGNOSIS — R519 Headache, unspecified: Secondary | ICD-10-CM | POA: Insufficient documentation
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
